# Patient Record
Sex: Male | Born: 1985
Health system: Southern US, Community
[De-identification: ages and names within clinical notes are randomized; demographics above are authoritative.]

## PROBLEM LIST (undated history)

## (undated) DIAGNOSIS — E119 Type 2 diabetes mellitus without complications: Secondary | ICD-10-CM

## (undated) DIAGNOSIS — J45909 Unspecified asthma, uncomplicated: Secondary | ICD-10-CM

---

## 2016-03-04 ENCOUNTER — Telehealth: Payer: Self-pay

## 2016-03-04 NOTE — Telephone Encounter (Signed)
Patient called and left voicemail message on nurse phone. He states that he was calling for results but he has not seen a physician here in our office. He states that he will try to find the physician name that he will be seeing and give us a call back. I did not see him listed in our system at all. He reports that he will call back once he determines if he called the wrong number or not.

## 2016-03-20 ENCOUNTER — Ambulatory Visit: Payer: Self-pay | Admitting: Cardiology

## 2016-03-26 ENCOUNTER — Encounter: Payer: Self-pay | Admitting: *Deleted

## 2016-03-26 ENCOUNTER — Ambulatory Visit: Payer: Self-pay | Admitting: Cardiology

## 2018-01-10 ENCOUNTER — Other Ambulatory Visit: Payer: Self-pay

## 2018-01-10 ENCOUNTER — Emergency Department (HOSPITAL_COMMUNITY): Payer: Worker's Compensation

## 2018-01-10 ENCOUNTER — Inpatient Hospital Stay (HOSPITAL_COMMUNITY)
Admission: EM | Admit: 2018-01-10 | Discharge: 2018-01-14 | DRG: 493 | Disposition: A | Discharge status: Home Health | Payer: Worker's Compensation | Attending: General Surgery | Admitting: General Surgery

## 2018-01-10 ENCOUNTER — Encounter (HOSPITAL_COMMUNITY): Payer: Self-pay

## 2018-01-10 ENCOUNTER — Encounter (HOSPITAL_COMMUNITY): Admission: EM | Disposition: A | Discharge status: Home Health | Payer: Self-pay | Source: Home / Self Care

## 2018-01-10 ENCOUNTER — Observation Stay (HOSPITAL_COMMUNITY): Payer: Worker's Compensation | Admitting: Anesthesiology

## 2018-01-10 ENCOUNTER — Inpatient Hospital Stay (HOSPITAL_COMMUNITY): Payer: Worker's Compensation

## 2018-01-10 ENCOUNTER — Observation Stay (HOSPITAL_COMMUNITY): Payer: Worker's Compensation

## 2018-01-10 DIAGNOSIS — S0990XA Unspecified injury of head, initial encounter: Secondary | ICD-10-CM

## 2018-01-10 DIAGNOSIS — Z23 Encounter for immunization: Secondary | ICD-10-CM | POA: Diagnosis not present

## 2018-01-10 DIAGNOSIS — S82201B Unspecified fracture of shaft of right tibia, initial encounter for open fracture type I or II: Secondary | ICD-10-CM

## 2018-01-10 DIAGNOSIS — S82261C Displaced segmental fracture of shaft of right tibia, initial encounter for open fracture type IIIA, IIIB, or IIIC: Principal | ICD-10-CM | POA: Diagnosis present

## 2018-01-10 DIAGNOSIS — E872 Acidosis: Secondary | ICD-10-CM | POA: Diagnosis present

## 2018-01-10 DIAGNOSIS — Z419 Encounter for procedure for purposes other than remedying health state, unspecified: Secondary | ICD-10-CM

## 2018-01-10 DIAGNOSIS — S82401B Unspecified fracture of shaft of right fibula, initial encounter for open fracture type I or II: Secondary | ICD-10-CM | POA: Diagnosis present

## 2018-01-10 DIAGNOSIS — T1490XA Injury, unspecified, initial encounter: Secondary | ICD-10-CM

## 2018-01-10 DIAGNOSIS — M79661 Pain in right lower leg: Secondary | ICD-10-CM | POA: Diagnosis present

## 2018-01-10 DIAGNOSIS — Y92410 Unspecified street and highway as the place of occurrence of the external cause: Secondary | ICD-10-CM | POA: Diagnosis not present

## 2018-01-10 DIAGNOSIS — S060X0A Concussion without loss of consciousness, initial encounter: Secondary | ICD-10-CM | POA: Diagnosis present

## 2018-01-10 DIAGNOSIS — Z09 Encounter for follow-up examination after completed treatment for conditions other than malignant neoplasm: Secondary | ICD-10-CM

## 2018-01-10 DIAGNOSIS — S82451B Displaced comminuted fracture of shaft of right fibula, initial encounter for open fracture type I or II: Secondary | ICD-10-CM | POA: Diagnosis present

## 2018-01-10 DIAGNOSIS — F17210 Nicotine dependence, cigarettes, uncomplicated: Secondary | ICD-10-CM | POA: Diagnosis present

## 2018-01-10 DIAGNOSIS — Y990 Civilian activity done for income or pay: Secondary | ICD-10-CM | POA: Diagnosis not present

## 2018-01-10 DIAGNOSIS — J45909 Unspecified asthma, uncomplicated: Secondary | ICD-10-CM | POA: Diagnosis present

## 2018-01-10 DIAGNOSIS — S82261B Displaced segmental fracture of shaft of right tibia, initial encounter for open fracture type I or II: Secondary | ICD-10-CM | POA: Diagnosis present

## 2018-01-10 HISTORY — DX: Unspecified asthma, uncomplicated: J45.909

## 2018-01-10 HISTORY — PX: I & D EXTREMITY: SHX5045

## 2018-01-10 HISTORY — PX: TIBIA IM NAIL INSERTION: SHX2516

## 2018-01-10 LAB — TYPE AND SCREEN
ABO/RH(D): O POS
Antibody Screen: NEGATIVE
UNIT DIVISION: 0
Unit division: 0

## 2018-01-10 LAB — I-STAT CHEM 8, ED
BUN: 19 mg/dL (ref 6–20)
CALCIUM ION: 1.13 mmol/L — AB (ref 1.15–1.40)
Chloride: 106 mmol/L (ref 98–111)
Creatinine, Ser: 1.1 mg/dL (ref 0.61–1.24)
GLUCOSE: 125 mg/dL — AB (ref 70–99)
HCT: 46 % (ref 39.0–52.0)
HEMOGLOBIN: 15.6 g/dL (ref 13.0–17.0)
Potassium: 3.2 mmol/L — ABNORMAL LOW (ref 3.5–5.1)
Sodium: 141 mmol/L (ref 135–145)
TCO2: 22 mmol/L (ref 22–32)

## 2018-01-10 LAB — PREPARE FRESH FROZEN PLASMA
UNIT DIVISION: 0
Unit division: 0

## 2018-01-10 LAB — BPAM FFP
Blood Product Expiration Date: 201908212359
Blood Product Expiration Date: 201908212359
ISSUE DATE / TIME: 201908170914
ISSUE DATE / TIME: 201908170914
UNIT TYPE AND RH: 600
Unit Type and Rh: 6200

## 2018-01-10 LAB — BPAM RBC
BLOOD PRODUCT EXPIRATION DATE: 201909102359
BLOOD PRODUCT EXPIRATION DATE: 201909202359
ISSUE DATE / TIME: 201908170913
ISSUE DATE / TIME: 201908170913
UNIT TYPE AND RH: 9500
Unit Type and Rh: 9500

## 2018-01-10 LAB — COMPREHENSIVE METABOLIC PANEL
ALT: 62 U/L — ABNORMAL HIGH (ref 0–44)
ANION GAP: 10 (ref 5–15)
AST: 46 U/L — ABNORMAL HIGH (ref 15–41)
Albumin: 4.3 g/dL (ref 3.5–5.0)
Alkaline Phosphatase: 73 U/L (ref 38–126)
BUN: 18 mg/dL (ref 6–20)
CO2: 21 mmol/L — AB (ref 22–32)
Calcium: 9.3 mg/dL (ref 8.9–10.3)
Chloride: 107 mmol/L (ref 98–111)
Creatinine, Ser: 1.2 mg/dL (ref 0.61–1.24)
GFR calc non Af Amer: >60 mL/min (ref >60)
Glucose, Bld: 130 mg/dL — ABNORMAL HIGH (ref 70–99)
Potassium: 3.3 mmol/L — ABNORMAL LOW (ref 3.5–5.1)
SODIUM: 138 mmol/L (ref 135–145)
Total Bilirubin: 1.2 mg/dL (ref 0.3–1.2)
Total Protein: 6.7 g/dL (ref 6.5–8.1)

## 2018-01-10 LAB — I-STAT CG4 LACTIC ACID, ED: Lactic Acid, Venous: 2.06 mmol/L (ref 0.5–1.9)

## 2018-01-10 LAB — ABO/RH: ABO/RH(D): O POS

## 2018-01-10 LAB — CDS SEROLOGY

## 2018-01-10 LAB — CBC
HCT: 47 % (ref 39.0–52.0)
HEMOGLOBIN: 16 g/dL (ref 13.0–17.0)
MCH: 31 pg (ref 26.0–34.0)
MCHC: 34 g/dL (ref 30.0–36.0)
MCV: 91.1 fL (ref 78.0–100.0)
Platelets: 284 10*3/uL (ref 150–400)
RBC: 5.16 MIL/uL (ref 4.22–5.81)
RDW: 12.2 % (ref 11.5–15.5)
WBC: 18.2 10*3/uL — ABNORMAL HIGH (ref 4.0–10.5)

## 2018-01-10 LAB — ETHANOL

## 2018-01-10 LAB — PROTIME-INR
INR: 0.96
PROTHROMBIN TIME: 12.7 s (ref 11.4–15.2)

## 2018-01-10 LAB — LACTIC ACID, PLASMA: Lactic Acid, Venous: 1 mmol/L (ref 0.5–1.9)

## 2018-01-10 SURGERY — INSERTION, INTRAMEDULLARY ROD, TIBIA
Anesthesia: General | Site: Leg Lower | Laterality: Right

## 2018-01-10 MED ORDER — SENNA 8.6 MG PO TABS
1.0000 | ORAL_TABLET | Freq: Two times a day (BID) | ORAL | Status: DC
  Administered 2018-01-10 – 2018-01-14 (×7): 8.6 mg via ORAL
  Filled 2018-01-10 (×7): qty 1

## 2018-01-10 MED ORDER — DOCUSATE SODIUM 100 MG PO CAPS
100.0000 mg | ORAL_CAPSULE | Freq: Two times a day (BID) | ORAL | Status: DC
  Administered 2018-01-10 – 2018-01-14 (×8): 100 mg via ORAL
  Filled 2018-01-10 (×8): qty 1

## 2018-01-10 MED ORDER — MIDAZOLAM HCL 2 MG/2ML IJ SOLN
INTRAMUSCULAR | Status: AC
  Filled 2018-01-10: qty 2

## 2018-01-10 MED ORDER — METHOCARBAMOL 500 MG PO TABS: 500.0000 mg | ORAL_TABLET | Freq: Three times a day (TID) | ORAL | Status: DC | PRN

## 2018-01-10 MED ORDER — CEFAZOLIN SODIUM-DEXTROSE 2-4 GM/100ML-% IV SOLN
INTRAVENOUS | Status: AC
  Filled 2018-01-10: qty 100

## 2018-01-10 MED ORDER — CEFAZOLIN SODIUM-DEXTROSE 1-4 GM/50ML-% IV SOLN
INTRAVENOUS | Status: AC | PRN
  Administered 2018-01-10: 2 g via INTRAVENOUS

## 2018-01-10 MED ORDER — OXYCODONE HCL 5 MG PO TABS: 5.0000 mg | ORAL_TABLET | Freq: Once | ORAL | Status: DC | PRN

## 2018-01-10 MED ORDER — FENTANYL CITRATE (PF) 100 MCG/2ML IJ SOLN
INTRAMUSCULAR | Status: AC | PRN
  Administered 2018-01-10: 100 ug via INTRAVENOUS

## 2018-01-10 MED ORDER — PROMETHAZINE HCL 25 MG/ML IJ SOLN: 6.2500 mg | INTRAMUSCULAR | Status: DC | PRN

## 2018-01-10 MED ORDER — PROPOFOL 10 MG/ML IV BOLUS
INTRAVENOUS | Status: AC
  Filled 2018-01-10: qty 20

## 2018-01-10 MED ORDER — 0.9 % SODIUM CHLORIDE (POUR BTL) OPTIME
TOPICAL | Status: DC | PRN
  Administered 2018-01-10: 1000 mL

## 2018-01-10 MED ORDER — ONDANSETRON HCL 4 MG/2ML IJ SOLN: 4.0000 mg | Freq: Four times a day (QID) | INTRAMUSCULAR | Status: DC | PRN

## 2018-01-10 MED ORDER — OXYCODONE HCL 5 MG PO TABS
5.0000 mg | ORAL_TABLET | ORAL | Status: DC | PRN
  Administered 2018-01-11 – 2018-01-14 (×15): 10 mg via ORAL
  Filled 2018-01-10 (×16): qty 2

## 2018-01-10 MED ORDER — CEFAZOLIN SODIUM-DEXTROSE 2-4 GM/100ML-% IV SOLN
2.0000 g | Freq: Three times a day (TID) | INTRAVENOUS | Status: AC
  Administered 2018-01-10 – 2018-01-12 (×6): 2 g via INTRAVENOUS
  Filled 2018-01-10 (×6): qty 100

## 2018-01-10 MED ORDER — HYDROCODONE-ACETAMINOPHEN 5-325 MG PO TABS: 1.0000 | ORAL_TABLET | ORAL | Status: DC | PRN

## 2018-01-10 MED ORDER — CEFAZOLIN SODIUM-DEXTROSE 2-4 GM/100ML-% IV SOLN
2.0000 g | Freq: Once | INTRAVENOUS | Status: AC
  Administered 2018-01-10: 2 g via INTRAVENOUS
  Filled 2018-01-10: qty 100

## 2018-01-10 MED ORDER — LIDOCAINE 2% (20 MG/ML) 5 ML SYRINGE
INTRAMUSCULAR | Status: DC | PRN
  Administered 2018-01-10: 100 mg via INTRAVENOUS

## 2018-01-10 MED ORDER — ACETAMINOPHEN 10 MG/ML IV SOLN
INTRAVENOUS | Status: AC
  Filled 2018-01-10: qty 100

## 2018-01-10 MED ORDER — FENTANYL CITRATE (PF) 100 MCG/2ML IJ SOLN
INTRAMUSCULAR | Status: AC
  Administered 2018-01-10: 50 ug via INTRAVENOUS
  Filled 2018-01-10: qty 2

## 2018-01-10 MED ORDER — VANCOMYCIN HCL 500 MG IV SOLR
INTRAVENOUS | Status: DC | PRN
  Administered 2018-01-10: 500 mg via TOPICAL

## 2018-01-10 MED ORDER — IOPAMIDOL (ISOVUE-300) INJECTION 61%
INTRAVENOUS | Status: AC
  Administered 2018-01-10: 100 mL
  Filled 2018-01-10: qty 100

## 2018-01-10 MED ORDER — ROCURONIUM BROMIDE 50 MG/5ML IV SOSY
PREFILLED_SYRINGE | INTRAVENOUS | Status: AC
  Filled 2018-01-10: qty 5

## 2018-01-10 MED ORDER — TETANUS-DIPHTH-ACELL PERTUSSIS 5-2.5-18.5 LF-MCG/0.5 IM SUSP
INTRAMUSCULAR | Status: AC
  Filled 2018-01-10: qty 0.5

## 2018-01-10 MED ORDER — ROCURONIUM BROMIDE 50 MG/5ML IV SOSY
PREFILLED_SYRINGE | INTRAVENOUS | Status: DC | PRN
  Administered 2018-01-10: 50 mg via INTRAVENOUS

## 2018-01-10 MED ORDER — SODIUM CHLORIDE 0.9 % IV SOLN
INTRAVENOUS | Status: DC
  Administered 2018-01-10: 18:00:00 via INTRAVENOUS

## 2018-01-10 MED ORDER — DEXAMETHASONE SODIUM PHOSPHATE 10 MG/ML IJ SOLN
INTRAMUSCULAR | Status: DC | PRN
  Administered 2018-01-10: 10 mg via INTRAVENOUS

## 2018-01-10 MED ORDER — ESMOLOL HCL 100 MG/10ML IV SOLN
INTRAVENOUS | Status: DC | PRN
  Administered 2018-01-10: 30 mg via INTRAVENOUS

## 2018-01-10 MED ORDER — ONDANSETRON HCL 4 MG/2ML IJ SOLN
INTRAMUSCULAR | Status: AC
  Filled 2018-01-10: qty 2

## 2018-01-10 MED ORDER — DEXTROSE 5 % IV SOLN
500.0000 mg | Freq: Once | INTRAVENOUS | Status: AC
  Administered 2018-01-10: 500 mg via INTRAVENOUS
  Filled 2018-01-10: qty 5

## 2018-01-10 MED ORDER — ONDANSETRON HCL 4 MG/2ML IJ SOLN
INTRAMUSCULAR | Status: DC | PRN
  Administered 2018-01-10: 4 mg via INTRAVENOUS

## 2018-01-10 MED ORDER — METOCLOPRAMIDE HCL 5 MG PO TABS: 5.0000 mg | ORAL_TABLET | Freq: Three times a day (TID) | ORAL | Status: DC | PRN

## 2018-01-10 MED ORDER — MIDAZOLAM HCL 5 MG/5ML IJ SOLN
INTRAMUSCULAR | Status: DC | PRN
  Administered 2018-01-10: 2 mg via INTRAVENOUS

## 2018-01-10 MED ORDER — SUCCINYLCHOLINE CHLORIDE 200 MG/10ML IV SOSY
PREFILLED_SYRINGE | INTRAVENOUS | Status: AC
  Filled 2018-01-10: qty 10

## 2018-01-10 MED ORDER — HYDROCODONE-ACETAMINOPHEN 7.5-325 MG PO TABS
1.0000 | ORAL_TABLET | ORAL | Status: DC | PRN
  Administered 2018-01-10 – 2018-01-11 (×2): 1 via ORAL
  Filled 2018-01-10 (×3): qty 1

## 2018-01-10 MED ORDER — SODIUM CHLORIDE 0.9 % IR SOLN
Status: DC | PRN
  Administered 2018-01-10: 6000 mL

## 2018-01-10 MED ORDER — LIDOCAINE 2% (20 MG/ML) 5 ML SYRINGE
INTRAMUSCULAR | Status: AC
  Filled 2018-01-10: qty 5

## 2018-01-10 MED ORDER — SUGAMMADEX SODIUM 200 MG/2ML IV SOLN
INTRAVENOUS | Status: DC | PRN
  Administered 2018-01-10: 200 mg via INTRAVENOUS

## 2018-01-10 MED ORDER — FENTANYL CITRATE (PF) 250 MCG/5ML IJ SOLN
INTRAMUSCULAR | Status: AC
  Filled 2018-01-10: qty 5

## 2018-01-10 MED ORDER — DIPHENHYDRAMINE HCL 12.5 MG/5ML PO ELIX: 12.5000 mg | ORAL_SOLUTION | ORAL | Status: DC | PRN

## 2018-01-10 MED ORDER — POTASSIUM CHLORIDE IN NACL 20-0.9 MEQ/L-% IV SOLN
INTRAVENOUS | Status: DC
  Administered 2018-01-10 – 2018-01-12 (×4): via INTRAVENOUS
  Filled 2018-01-10 (×4): qty 1000

## 2018-01-10 MED ORDER — ENOXAPARIN SODIUM 40 MG/0.4ML ~~LOC~~ SOLN
40.0000 mg | SUBCUTANEOUS | Status: DC
  Administered 2018-01-11 – 2018-01-14 (×4): 40 mg via SUBCUTANEOUS
  Filled 2018-01-10 (×5): qty 0.4

## 2018-01-10 MED ORDER — FENTANYL CITRATE (PF) 100 MCG/2ML IJ SOLN
25.0000 ug | INTRAMUSCULAR | Status: DC | PRN
  Administered 2018-01-10 (×2): 50 ug via INTRAVENOUS

## 2018-01-10 MED ORDER — SODIUM CHLORIDE 0.9 % IV SOLN
INTRAVENOUS | Status: AC | PRN
  Administered 2018-01-10: 1000 mL via INTRAVENOUS

## 2018-01-10 MED ORDER — LACTATED RINGERS IV SOLN
INTRAVENOUS | Status: DC
  Administered 2018-01-10 (×2): via INTRAVENOUS

## 2018-01-10 MED ORDER — PROPOFOL 10 MG/ML IV BOLUS
INTRAVENOUS | Status: DC | PRN
  Administered 2018-01-10 (×2): 100 mg via INTRAVENOUS
  Administered 2018-01-10: 200 mg via INTRAVENOUS

## 2018-01-10 MED ORDER — VANCOMYCIN HCL 500 MG IV SOLR
INTRAVENOUS | Status: AC
  Filled 2018-01-10: qty 500

## 2018-01-10 MED ORDER — OXYCODONE HCL 5 MG/5ML PO SOLN: 5.0000 mg | Freq: Once | ORAL | Status: DC | PRN

## 2018-01-10 MED ORDER — FENTANYL CITRATE (PF) 100 MCG/2ML IJ SOLN
INTRAMUSCULAR | Status: DC | PRN
  Administered 2018-01-10: 100 ug via INTRAVENOUS
  Administered 2018-01-10 (×3): 50 ug via INTRAVENOUS

## 2018-01-10 MED ORDER — TETANUS-DIPHTH-ACELL PERTUSSIS 5-2.5-18.5 LF-MCG/0.5 IM SUSP
0.5000 mL | Freq: Once | INTRAMUSCULAR | Status: AC
  Administered 2018-01-10: 0.5 mL via INTRAMUSCULAR

## 2018-01-10 MED ORDER — MORPHINE SULFATE (PF) 2 MG/ML IV SOLN
0.5000 mg | INTRAVENOUS | Status: DC | PRN
  Administered 2018-01-11 – 2018-01-12 (×5): 1 mg via INTRAVENOUS
  Filled 2018-01-10 (×6): qty 1

## 2018-01-10 MED ORDER — METOCLOPRAMIDE HCL 5 MG/ML IJ SOLN: 5.0000 mg | Freq: Three times a day (TID) | INTRAMUSCULAR | Status: DC | PRN

## 2018-01-10 MED ORDER — ACETAMINOPHEN 325 MG PO TABS: 650.0000 mg | ORAL_TABLET | ORAL | Status: DC | PRN

## 2018-01-10 MED ORDER — HYDROMORPHONE HCL 1 MG/ML IJ SOLN
1.0000 mg | INTRAMUSCULAR | Status: DC | PRN
  Administered 2018-01-10: 1 mg via INTRAVENOUS
  Filled 2018-01-10: qty 1

## 2018-01-10 MED ORDER — DEXAMETHASONE SODIUM PHOSPHATE 10 MG/ML IJ SOLN
INTRAMUSCULAR | Status: AC
  Filled 2018-01-10: qty 1

## 2018-01-10 MED ORDER — ACETAMINOPHEN 10 MG/ML IV SOLN
INTRAVENOUS | Status: AC
  Administered 2018-01-10: 1000 mg via INTRAVENOUS
  Filled 2018-01-10: qty 100

## 2018-01-10 MED ORDER — ONDANSETRON 4 MG PO TBDP: 4.0000 mg | ORAL_TABLET | Freq: Four times a day (QID) | ORAL | Status: DC | PRN

## 2018-01-10 MED ORDER — ACETAMINOPHEN 10 MG/ML IV SOLN
1000.0000 mg | Freq: Once | INTRAVENOUS | Status: AC
Start: 2018-01-10 — End: 2018-01-10
  Administered 2018-01-10: 1000 mg via INTRAVENOUS

## 2018-01-10 MED ORDER — SUCCINYLCHOLINE CHLORIDE 20 MG/ML IJ SOLN
INTRAMUSCULAR | Status: DC | PRN
  Administered 2018-01-10: 120 mg via INTRAVENOUS

## 2018-01-10 SURGICAL SUPPLY — 95 items
BANDAGE ACE 4X5 VEL STRL LF (GAUZE/BANDAGES/DRESSINGS) ×3 IMPLANT
BANDAGE ACE 6X5 VEL STRL LF (GAUZE/BANDAGES/DRESSINGS) ×3 IMPLANT
BANDAGE ELASTIC 4 VELCRO ST LF (GAUZE/BANDAGES/DRESSINGS) ×3 IMPLANT
BANDAGE ESMARK 6X9 LF (GAUZE/BANDAGES/DRESSINGS) ×1 IMPLANT
BIT DRILL 2.5X2.75 QC CALB (BIT) ×3 IMPLANT
BIT DRILL 4.4 (MISCELLANEOUS) ×3 IMPLANT
BIT DRILL 6X3.8 (MISCELLANEOUS) ×3 IMPLANT
BLADE SURG 10 STRL SS (BLADE) ×3 IMPLANT
BNDG COHESIVE 4X5 TAN STRL (GAUZE/BANDAGES/DRESSINGS) ×3 IMPLANT
BNDG COHESIVE 6X5 TAN STRL LF (GAUZE/BANDAGES/DRESSINGS) ×3 IMPLANT
BNDG ESMARK 6X9 LF (GAUZE/BANDAGES/DRESSINGS) ×3
BNDG GAUZE ELAST 4 BULKY (GAUZE/BANDAGES/DRESSINGS) ×3 IMPLANT
BUCKET CAST 5QT PAPER WAX WHT (CAST SUPPLIES) ×3 IMPLANT
COVER SURGICAL LIGHT HANDLE (MISCELLANEOUS) ×6 IMPLANT
CUFF TOURNIQUET SINGLE 18IN (TOURNIQUET CUFF) IMPLANT
CUFF TOURNIQUET SINGLE 34IN LL (TOURNIQUET CUFF) ×3 IMPLANT
CUFF TOURNIQUET SINGLE 44IN (TOURNIQUET CUFF) IMPLANT
DRAPE C-ARM 42X72 X-RAY (DRAPES) ×3 IMPLANT
DRAPE C-ARMOR (DRAPES) ×3 IMPLANT
DRAPE IMP U-DRAPE 54X76 (DRAPES) ×3 IMPLANT
DRAPE ORTHO SPLIT 77X108 STRL (DRAPES) ×2
DRAPE SURG ORHT 6 SPLT 77X108 (DRAPES) ×1 IMPLANT
DRAPE U-SHAPE 47X51 STRL (DRAPES) ×3 IMPLANT
DRSG ADAPTIC 3X8 NADH LF (GAUZE/BANDAGES/DRESSINGS) ×3 IMPLANT
DRSG MEPITEL 3X4 ME34 (GAUZE/BANDAGES/DRESSINGS) ×3 IMPLANT
DRSG PAD ABDOMINAL 8X10 ST (GAUZE/BANDAGES/DRESSINGS) ×6 IMPLANT
DRSG VAC ATS MED SENSATRAC (GAUZE/BANDAGES/DRESSINGS) ×3 IMPLANT
DURAPREP 26ML APPLICATOR (WOUND CARE) ×3 IMPLANT
ELECT REM PT RETURN 9FT ADLT (ELECTROSURGICAL) ×3
ELECTRODE REM PT RTRN 9FT ADLT (ELECTROSURGICAL) ×1 IMPLANT
FACESHIELD WRAPAROUND (MASK) ×3 IMPLANT
GAUZE SPONGE 4X4 12PLY STRL (GAUZE/BANDAGES/DRESSINGS) ×3 IMPLANT
GAUZE SPONGE 4X4 16PLY XRAY LF (GAUZE/BANDAGES/DRESSINGS) ×6 IMPLANT
GLOVE BIO SURGEON STRL SZ 6.5 (GLOVE) ×2 IMPLANT
GLOVE BIO SURGEON STRL SZ7 (GLOVE) ×3 IMPLANT
GLOVE BIO SURGEON STRL SZ8 (GLOVE) ×6 IMPLANT
GLOVE BIO SURGEONS STRL SZ 6.5 (GLOVE) ×1
GLOVE BIOGEL M 7.0 STRL (GLOVE) IMPLANT
GLOVE BIOGEL M STRL SZ7.5 (GLOVE) IMPLANT
GLOVE BIOGEL PI IND STRL 7.5 (GLOVE) ×1 IMPLANT
GLOVE BIOGEL PI IND STRL 8 (GLOVE) ×1 IMPLANT
GLOVE BIOGEL PI IND STRL 8.5 (GLOVE) ×1 IMPLANT
GLOVE BIOGEL PI INDICATOR 7.5 (GLOVE) ×2
GLOVE BIOGEL PI INDICATOR 8 (GLOVE) ×2
GLOVE BIOGEL PI INDICATOR 8.5 (GLOVE) ×2
GLOVE ECLIPSE 8.0 STRL XLNG CF (GLOVE) IMPLANT
GLOVE ORTHO TXT STRL SZ7.5 (GLOVE) ×6 IMPLANT
GLOVE SURG ORTHO 8.5 STRL (GLOVE) ×9 IMPLANT
GOWN STRL REUS W/ TWL LRG LVL3 (GOWN DISPOSABLE) ×3 IMPLANT
GOWN STRL REUS W/TWL LRG LVL3 (GOWN DISPOSABLE) ×9 IMPLANT
GOWN STRL REUS W/TWL XL LVL3 (GOWN DISPOSABLE) ×12 IMPLANT
GUIDEWIRE BALL NOSE 80CM (WIRE) ×3 IMPLANT
HANDPIECE INTERPULSE COAX TIP (DISPOSABLE) ×2
KIT BASIN OR (CUSTOM PROCEDURE TRAY) ×3 IMPLANT
KIT TURNOVER KIT B (KITS) ×3 IMPLANT
MANIFOLD NEPTUNE II (INSTRUMENTS) ×3 IMPLANT
NAIL TIBIAL 8X36CM (Nail) ×3 IMPLANT
NAIL TIBIAL 9MMX36CM (Nail) ×3 IMPLANT
PACK ORTHO EXTREMITY (CUSTOM PROCEDURE TRAY) ×3 IMPLANT
PACK UNIVERSAL I (CUSTOM PROCEDURE TRAY) ×3 IMPLANT
PAD ARMBOARD 7.5X6 YLW CONV (MISCELLANEOUS) ×6 IMPLANT
PAD CAST 4YDX4 CTTN HI CHSV (CAST SUPPLIES) ×1 IMPLANT
PADDING CAST COTTON 4X4 STRL (CAST SUPPLIES) ×2
PADDING CAST COTTON 6X4 STRL (CAST SUPPLIES) ×3 IMPLANT
PADDING CAST SYN 6 (CAST SUPPLIES) ×2
PADDING CAST SYNTHETIC 4 (CAST SUPPLIES) ×2
PADDING CAST SYNTHETIC 4X4 STR (CAST SUPPLIES) ×1 IMPLANT
PADDING CAST SYNTHETIC 6X4 NS (CAST SUPPLIES) ×1 IMPLANT
PIN GUIDE 3.2X14 1401214 (MISCELLANEOUS) ×3 IMPLANT
PLATE ACE 100DEG 6HOLE (Plate) ×3 IMPLANT
SCREW ACECAP 36MM (Screw) ×3 IMPLANT
SCREW ACECAP 42MM (Screw) ×3 IMPLANT
SCREW CORTICAL 3.5MM  12MM (Screw) ×10 IMPLANT
SCREW CORTICAL 3.5MM 12MM (Screw) ×5 IMPLANT
SCREW PROXIMAL 4.5MMX18MM (Screw) ×3 IMPLANT
SCREW PROXIMAL DEPUY (Screw) ×2 IMPLANT
SCREW PRXML FT 45X5.5XLCK NS (Screw) ×1 IMPLANT
SET HNDPC FAN SPRY TIP SCT (DISPOSABLE) ×1 IMPLANT
SPONGE LAP 18X18 X RAY DECT (DISPOSABLE) ×3 IMPLANT
STAPLER VISISTAT 35W (STAPLE) IMPLANT
SUT ETHILON 2 0 PSLX (SUTURE) ×9 IMPLANT
SUT ETHILON 3 0 PS 1 (SUTURE) ×3 IMPLANT
SUT MON AB 2-0 CT1 27 (SUTURE) ×3 IMPLANT
SUT VIC AB 1 CT1 27 (SUTURE) ×2
SUT VIC AB 1 CT1 27XBRD ANBCTR (SUTURE) ×1 IMPLANT
SUT VIC AB 2-0 CT1 27 (SUTURE) ×4
SUT VIC AB 2-0 CT1 TAPERPNT 27 (SUTURE) ×2 IMPLANT
SYR CONTROL 10ML LL (SYRINGE) ×3 IMPLANT
TOWEL OR 17X24 6PK STRL BLUE (TOWEL DISPOSABLE) ×3 IMPLANT
TOWEL OR 17X26 10 PK STRL BLUE (TOWEL DISPOSABLE) ×6 IMPLANT
TUBE CONNECTING 12'X1/4 (SUCTIONS) ×1
TUBE CONNECTING 12X1/4 (SUCTIONS) ×2 IMPLANT
UNDERPAD 30X30 (UNDERPADS AND DIAPERS) ×3 IMPLANT
WND VAC CANISTER 500ML (MISCELLANEOUS) ×3 IMPLANT
YANKAUER SUCT BULB TIP NO VENT (SUCTIONS) ×3 IMPLANT

## 2018-01-10 NOTE — ED Notes (Signed)
Pt taken to xray 

## 2018-01-10 NOTE — ED Notes (Signed)
Pt's c-collar replaced with aspen collar by this RN with assistance from [employer] RN
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
## 2018-01-10 NOTE — Transfer of Care (Signed)
Immediate Anesthesia Transfer of Care Note  Patient: Francis Mahoney  Procedure(s) Performed: INTRAMEDULLARY (IM) NAIL TIBIAL (Right Leg Lower) IRRIGATION AND DEBRIDEMENT TIBIA (Right Leg Lower)  Patient Location: PACU  Anesthesia Type:General  Level of Consciousness: drowsy  Airway & Oxygen Therapy: Patient Spontanous Breathing and Patient connected to nasal cannula oxygen  Post-op Assessment: Report given to RN, Post -op Vital signs reviewed and stable and Patient moving all extremities X 4  Post vital signs: Reviewed and stable  Last Vitals:  Vitals Value Taken Time  BP 127/84 01/10/2018  3:48 PM  Temp    Pulse 100 01/10/2018  3:51 PM  Resp 18 01/10/2018  3:51 PM  SpO2 96 % 01/10/2018  3:51 PM  Vitals shown include unvalidated device data.  Last Pain:  Vitals:   01/10/18 1113  TempSrc:   PainSc: 10-Worst pain ever         Complications: No apparent anesthesia complications

## 2018-01-10 NOTE — Anesthesia Procedure Notes (Addendum)
Procedure Name: Intubation Date/Time: 01/10/2018 12:56 PM Performed by: Fransisca KaufmannMeyer, Chanay Nugent E, CRNA Pre-anesthesia Checklist: Patient identified, Emergency Drugs available, Suction available and Patient being monitored Patient Re-evaluated:Patient Re-evaluated prior to induction Oxygen Delivery Method: Circle System Utilized Preoxygenation: Pre-oxygenation with 100% oxygen Induction Type: IV induction and Rapid sequence Ventilation: Mask ventilation without difficulty Laryngoscope Size: Glidescope and 4 Grade View: Grade I Tube type: Oral Tube size: 7.5 mm Number of attempts: 1 Airway Equipment and Method: Stylet and Oral airway Placement Confirmation: ETT inserted through vocal cords under direct vision,  positive ETCO2 and breath sounds checked- equal and bilateral Secured at: 24 cm Tube secured with: Tape Dental Injury: Teeth and Oropharynx as per pre-operative assessment  Difficulty Due To: Difficult Airway- due to cervical collar Comments: Cervical collar remained in place during induction and intubation/no movement of neck occured
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
## 2018-01-10 NOTE — Anesthesia Postprocedure Evaluation (Signed)
Anesthesia Post Note  Patient: Francis Mahoney  Procedure(s) Performed: INTRAMEDULLARY (IM) NAIL TIBIAL (Right Leg Lower) IRRIGATION AND DEBRIDEMENT TIBIA (Right Leg Lower)     Patient location during evaluation: PACU Anesthesia Type: General Level of consciousness: awake and alert Pain management: pain level controlled Vital Signs Assessment: post-procedure vital signs reviewed and stable Respiratory status: spontaneous breathing, nonlabored ventilation, respiratory function stable and patient connected to nasal cannula oxygen Cardiovascular status: blood pressure returned to baseline and stable Postop Assessment: no apparent nausea or vomiting Anesthetic complications: no    Last Vitals:  Vitals:   01/10/18 1715 01/10/18 1737  BP: 126/77 134/88  Pulse: (!) 57 72  Resp: 15 18  Temp: 36.7 C 36.8 C  SpO2: 100% 97%    Last Pain:  Vitals:   01/10/18 1911  TempSrc:   PainSc: 6                  Beryle Lathehomas E Judea Riches

## 2018-01-10 NOTE — ED Notes (Signed)
Pt assisted to sit up and attempting to urinate using urinal
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
## 2018-01-11 LAB — BASIC METABOLIC PANEL
Anion gap: 5 (ref 5–15)
BUN: 11 mg/dL (ref 6–20)
CALCIUM: 8.8 mg/dL — AB (ref 8.9–10.3)
CO2: 23 mmol/L (ref 22–32)
CREATININE: 1.09 mg/dL (ref 0.61–1.24)
Chloride: 111 mmol/L (ref 98–111)
GFR calc Af Amer: >60 mL/min (ref >60)
Glucose, Bld: 105 mg/dL — ABNORMAL HIGH (ref 70–99)
Potassium: 4.5 mmol/L (ref 3.5–5.1)
SODIUM: 139 mmol/L (ref 135–145)

## 2018-01-11 LAB — CBC
HCT: 40.9 % (ref 39.0–52.0)
Hemoglobin: 14 g/dL (ref 13.0–17.0)
MCH: 31.3 pg (ref 26.0–34.0)
MCHC: 34.2 g/dL (ref 30.0–36.0)
MCV: 91.3 fL (ref 78.0–100.0)
PLATELETS: 245 10*3/uL (ref 150–400)
RBC: 4.48 MIL/uL (ref 4.22–5.81)
RDW: 12.4 % (ref 11.5–15.5)
WBC: 15.1 10*3/uL — ABNORMAL HIGH (ref 4.0–10.5)

## 2018-01-11 LAB — URINALYSIS, ROUTINE W REFLEX MICROSCOPIC
Bilirubin Urine: NEGATIVE
GLUCOSE, UA: NEGATIVE mg/dL
HGB URINE DIPSTICK: NEGATIVE
KETONES UR: 20 mg/dL — AB
Leukocytes, UA: NEGATIVE
Nitrite: NEGATIVE
PROTEIN: NEGATIVE mg/dL
Specific Gravity, Urine: 1.017 (ref 1.005–1.030)
pH: 5 (ref 5.0–8.0)

## 2018-01-11 LAB — HIV ANTIBODY (ROUTINE TESTING W REFLEX): HIV SCREEN 4TH GENERATION: NONREACTIVE

## 2018-01-11 MED ORDER — ACETAMINOPHEN 500 MG PO TABS
1000.0000 mg | ORAL_TABLET | Freq: Three times a day (TID) | ORAL | Status: DC
  Administered 2018-01-11 – 2018-01-14 (×11): 1000 mg via ORAL
  Filled 2018-01-11 (×13): qty 2

## 2018-01-11 MED ORDER — PNEUMOCOCCAL VAC POLYVALENT 25 MCG/0.5ML IJ INJ
0.5000 mL | INJECTION | INTRAMUSCULAR | Status: AC
  Administered 2018-01-12: 0.5 mL via INTRAMUSCULAR
  Filled 2018-01-11: qty 0.5

## 2018-01-11 MED ORDER — METHOCARBAMOL 500 MG PO TABS
500.0000 mg | ORAL_TABLET | Freq: Three times a day (TID) | ORAL | Status: DC
  Administered 2018-01-11 – 2018-01-12 (×4): 500 mg via ORAL
  Filled 2018-01-11 (×4): qty 1

## 2018-01-11 MED ORDER — POLYETHYLENE GLYCOL 3350 17 G PO PACK: 17.0000 g | PACK | Freq: Every day | ORAL | Status: DC | PRN

## 2018-01-11 MED ORDER — BACITRACIN-NEOMYCIN-POLYMYXIN OINTMENT TUBE
TOPICAL_OINTMENT | Freq: Three times a day (TID) | CUTANEOUS | Status: DC
  Administered 2018-01-11: 1 via TOPICAL
  Administered 2018-01-11 – 2018-01-12 (×3): via TOPICAL
  Administered 2018-01-12: 1 via TOPICAL
  Administered 2018-01-14 (×2): via TOPICAL
  Filled 2018-01-11: qty 14
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
## 2018-01-11 NOTE — Progress Notes (Signed)
Subjective: 1 Day Post-Op Procedure(s) (LRB): INTRAMEDULLARY (IM) NAIL TIBIAL (Right) IRRIGATION AND DEBRIDEMENT TIBIA (Right) Patient reports pain as 5 on 0-10 scale.   [name]'s pain is fairly well-controlled but he is having increased pain as he attempts to move his leg while in bed.  He has been able to eat and drink without difficulty.  He denies chest pain, SOB, N/V/D, fever, or chills.   Objective: Vital signs in last 24 hours: Temp:  [97.8 F (36.6 C)-98.7 F (37.1 C)] 98.7 F (37.1 C) (08/18 0533) Pulse Rate:  [57-111] 89 (08/18 0533) Resp:  [13-32] 16 (08/18 0533) BP: (118-149)/(73-99) 123/81 (08/18 0533) SpO2:  [90 %-100 %] 97 % (08/18 0533)  Intake/Output from previous day: 08/17 0701 - 08/18 0700 In: 3999.2 [P.O.:240; I.V.:3481.8; IV Piggyback:277.4] Out: 1525 [Urine:1300; Drains:25; Blood:200] Intake/Output this shift: Total I/O In: -  Out: 225 [Urine:225]  Recent Labs    01/10/18 0916 01/10/18 0917 01/11/18 0352  HGB 16.0 15.6 14.0   Recent Labs    01/10/18 0916 01/10/18 0917 01/11/18 0352  WBC 18.2*  --  15.1*  RBC 5.16  --  4.48  HCT 47.0 46.0 40.9  PLT 284  --  245   Recent Labs    01/10/18 0916 01/10/18 0917 01/11/18 0352  NA 138 141 139  K 3.3* 3.2* 4.5  CL 107 106 111  CO2 21*  --  23  BUN 18 19 11   CREATININE 1.20 1.10 1.09  GLUCOSE 130* 125* 105*  CALCIUM 9.3  --  8.8*   Recent Labs    01/10/18 0916  INR 0.96    A&O x 4 Sensation intact to light touch distally Neurovascularly intact Compartments are soft and compressible Dressing is C/D/I without drainage.  Able to wiggle all toes and is able to gently perform plantarflexion and dorsiflexion    Assessment/Plan: 1 Day Post-Op Procedure(s) (LRB): INTRAMEDULLARY (IM) NAIL TIBIAL (Right) IRRIGATION AND DEBRIDEMENT TIBIA (Right) Advance diet Up with therapy Touchdown WB to the RLE  Continue with IV antibiotics for the first 48 hours after surgery due to the open  fracture Continue with pain management as needed.    Francis Mahoney 01/11/2018, 9:22 AM
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
## 2018-01-11 NOTE — Plan of Care (Signed)

## 2018-01-12 ENCOUNTER — Encounter (HOSPITAL_COMMUNITY): Payer: Self-pay | Admitting: Orthopedic Surgery

## 2018-01-12 LAB — BASIC METABOLIC PANEL
Anion gap: 5 (ref 5–15)
BUN: 9 mg/dL (ref 6–20)
CALCIUM: 8.7 mg/dL — AB (ref 8.9–10.3)
CHLORIDE: 108 mmol/L (ref 98–111)
CO2: 26 mmol/L (ref 22–32)
CREATININE: 1.03 mg/dL (ref 0.61–1.24)
Glucose, Bld: 113 mg/dL — ABNORMAL HIGH (ref 70–99)
Potassium: 3.7 mmol/L (ref 3.5–5.1)
Sodium: 139 mmol/L (ref 135–145)

## 2018-01-12 LAB — CBC
HCT: 36.1 % — ABNORMAL LOW (ref 39.0–52.0)
HEMOGLOBIN: 12 g/dL — AB (ref 13.0–17.0)
MCH: 30.8 pg (ref 26.0–34.0)
MCHC: 33.2 g/dL (ref 30.0–36.0)
MCV: 92.6 fL (ref 78.0–100.0)
PLATELETS: 203 10*3/uL (ref 150–400)
RBC: 3.9 MIL/uL — ABNORMAL LOW (ref 4.22–5.81)
RDW: 12.7 % (ref 11.5–15.5)
WBC: 13.2 10*3/uL — ABNORMAL HIGH (ref 4.0–10.5)

## 2018-01-12 LAB — BLOOD PRODUCT ORDER (VERBAL) VERIFICATION

## 2018-01-12 MED ORDER — HYDROMORPHONE HCL 1 MG/ML IJ SOLN
1.0000 mg | Freq: Four times a day (QID) | INTRAMUSCULAR | Status: DC | PRN
  Administered 2018-01-12 – 2018-01-14 (×5): 1 mg via INTRAVENOUS
  Filled 2018-01-12 (×6): qty 1

## 2018-01-12 MED ORDER — HYDROMORPHONE HCL 1 MG/ML IJ SOLN
1.0000 mg | Freq: Once | INTRAMUSCULAR | Status: AC
  Administered 2018-01-12: 1 mg via INTRAVENOUS
  Filled 2018-01-12: qty 1

## 2018-01-12 MED ORDER — METHOCARBAMOL 750 MG PO TABS
750.0000 mg | ORAL_TABLET | Freq: Three times a day (TID) | ORAL | Status: DC
  Administered 2018-01-12 – 2018-01-14 (×7): 750 mg via ORAL
  Filled 2018-01-12 (×7): qty 1
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
## 2018-01-12 NOTE — Social Work (Signed)
CSW acknowledging consult for support with workers comp paperwork. For support with FMLA/Workers Comp on trauma service please consult RN Case Manager.   CSW signing off. Please consult if any additional needs arise.  Francis Mahoney, LCSWA [facility] Clinical Social Work [phone]
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
## 2018-01-12 NOTE — Progress Notes (Signed)
Patient ID: Francis Mahoney, male   DOB: May 08, 1986, 32 y.o.   MRN: [id number]   LOS: 2 days   Subjective: Doing well from lower leg standpoint. Denies N/T or excess pain. +ROM with PT/OT.   Objective: Vital signs in last 24 hours: Temp:  [98.6 F (37 C)-99.5 F (37.5 C)] 99.5 F (37.5 C) (08/19 0441) Pulse Rate:  [70-104] 104 (08/19 0441) Resp:  [14-19] 19 (08/19 0441) BP: (121-132)/(72-86) 121/81 (08/19 0441) SpO2:  [96 %-100 %] 96 % (08/19 0441) Last BM Date: 01/10/18   Laboratory  CBC Recent Labs    01/11/18 0352 01/12/18 0558  WBC 15.1* 13.2*  HGB 14.0 12.0*  HCT 40.9 36.1*  PLT 245 203   BMET Recent Labs    01/11/18 0352 01/12/18 0558  NA 139 139  K 4.5 3.7  CL 111 108  CO2 23 26  GLUCOSE 105* 113*  BUN 11 9  CREATININE 1.09 1.03  CALCIUM 8.8* 8.7*     Physical Exam General appearance: alert and no distress  RLE ACE, wound VAC in place  Compartments soft  Sens DPN, SPN, TN intact  Motor EHL, ext, flex, evers 5/5  DP 2+, No significant edema   Assessment/Plan: Open right tibia fx s/p IMN -- TDWB, ROM RLE. F/u with Dr. Linna CapriceSwinteck in office in 2 weeks.    Freeman CaldronMichael J. Aditri Louischarles, PA-C Orthopedic Surgery [phone] 01/12/2018
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
## 2018-01-13 ENCOUNTER — Encounter (HOSPITAL_COMMUNITY): Payer: Self-pay | Admitting: General Practice

## 2018-01-13 ENCOUNTER — Other Ambulatory Visit: Payer: Self-pay

## 2018-01-13 LAB — CBC
HCT: 34.4 % — ABNORMAL LOW (ref 39.0–52.0)
Hemoglobin: 11.6 g/dL — ABNORMAL LOW (ref 13.0–17.0)
MCH: 31.2 pg (ref 26.0–34.0)
MCHC: 33.7 g/dL (ref 30.0–36.0)
MCV: 92.5 fL (ref 78.0–100.0)
PLATELETS: 205 10*3/uL (ref 150–400)
RBC: 3.72 MIL/uL — ABNORMAL LOW (ref 4.22–5.81)
RDW: 12.4 % (ref 11.5–15.5)
WBC: 13.2 10*3/uL — ABNORMAL HIGH (ref 4.0–10.5)

## 2018-01-13 MED ORDER — ACETAMINOPHEN 500 MG PO TABS: 1000.0000 mg | ORAL_TABLET | Freq: Three times a day (TID) | ORAL | 0 refills | Status: AC | PRN

## 2018-01-13 MED ORDER — ENOXAPARIN SODIUM 40 MG/0.4ML ~~LOC~~ SOLN: 40.0000 mg | SUBCUTANEOUS | 0 refills | Status: DC

## 2018-01-13 MED ORDER — BACITRACIN-NEOMYCIN-POLYMYXIN OINTMENT TUBE: 1.0000 "application " | TOPICAL_OINTMENT | Freq: Three times a day (TID) | CUTANEOUS | Status: AC

## 2018-01-13 MED ORDER — DOCUSATE SODIUM 100 MG PO CAPS: 100.0000 mg | ORAL_CAPSULE | Freq: Two times a day (BID) | ORAL | 0 refills | Status: AC

## 2018-01-13 MED ORDER — OXYCODONE HCL 10 MG PO TABS: 5.0000 mg | ORAL_TABLET | Freq: Four times a day (QID) | ORAL | 0 refills | Status: DC | PRN

## 2018-01-13 MED ORDER — TRAMADOL HCL 50 MG PO TABS: 50.0000 mg | ORAL_TABLET | Freq: Four times a day (QID) | ORAL | 0 refills | Status: DC | PRN

## 2018-01-13 MED ORDER — METHOCARBAMOL 750 MG PO TABS: 750.0000 mg | ORAL_TABLET | Freq: Three times a day (TID) | ORAL | 0 refills | Status: DC | PRN

## 2018-01-13 MED ORDER — POLYETHYLENE GLYCOL 3350 17 G PO PACK: 17.0000 g | PACK | Freq: Every day | ORAL | 0 refills | Status: AC | PRN

## 2018-01-13 MED ORDER — TRAMADOL HCL 50 MG PO TABS
50.0000 mg | ORAL_TABLET | Freq: Four times a day (QID) | ORAL | Status: DC
  Administered 2018-01-13 – 2018-01-14 (×7): 50 mg via ORAL
  Filled 2018-01-13 (×7): qty 1

## 2018-01-13 NOTE — Progress Notes (Signed)
Pt for discharge next 24 hours.  Will need HH follow up, DME.  [facility], Dolores Loryeresa Rutledge, phone: [phone]; fax:  [phone].  Updated CM on pt condition and dc plan.  Faxed clinical information to case manager, per her request.  Faxed HH and DME orders to [facility] at [phone], attention Nursing/DME.    Quintella BatonJulie W. Monque Haggar, RN, BSN  Trauma/Neuro ICU Case Manager [phone]
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
## 2018-01-14 MED FILL — ENOXAPARIN 40 MG/0.4 ML SYR: 40 | 10 days supply | Qty: 4 | Fill #0

## 2018-01-14 MED FILL — oxyCODONE HCL 10 MG TABS: 10 | 3 days supply | Qty: 10 | Fill #0

## 2018-01-14 MED FILL — METHOCARBAMOL 750 MG TABS: 750 | 10 days supply | Qty: 30 | Fill #0

## 2018-01-14 MED FILL — traMADol HCL 50 MG TABS: 50 | 5 days supply | Qty: 20 | Fill #0
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
## 2018-01-14 NOTE — Progress Notes (Signed)
    Subjective:  Patient reports pain as moderate.  Denies N/V/CP/SOB. Denies numbness/tingling.  Objective:   VITALS:   Vitals:   01/13/18 0553 01/13/18 1341 01/13/18 2247 01/14/18 0535  BP: (!) 149/97 126/84 139/83 (!) 141/93  Pulse: 97 93 91 95  Resp: 18 20 17 18   Temp: 98.6 F (37 C) 99 F (37.2 C) 98.8 F (37.1 C)   TempSrc: Oral Oral Oral   SpO2: 99% 100% 97% 99%  Weight:      Height:        NAD ABD soft Sensation intact distally Intact pulses distally Dorsiflexion/Plantar flexion intact Incision: dressing C/D/I Compartment soft No pain with passive stretch   Lab Results  Component Value Date   WBC 13.2 (H) 01/13/2018   HGB 11.6 (L) 01/13/2018   HCT 34.4 (L) 01/13/2018   MCV 92.5 01/13/2018   PLT 205 01/13/2018   BMET    Component Value Date/Time   NA 139 01/12/2018 0558   K 3.7 01/12/2018 0558   CL 108 01/12/2018 0558   CO2 26 01/12/2018 0558   GLUCOSE 113 (H) 01/12/2018 0558   BUN 9 01/12/2018 0558   CREATININE 1.03 01/12/2018 0558   CALCIUM 8.7 (L) 01/12/2018 0558   GFRNONAA >60 01/12/2018 0558   GFRAA >60 01/12/2018 0558     Assessment/Plan: 4 Days Post-Op   Active Problems:   Open fracture of right tibia and fibula   Open displaced segmental fracture of shaft of right tibia   TDWB with walker DVT ppx: lovenox in house --> d/c home on ASA, SCDs, TEDS PO pain control PT/OT Dispo: D/C home when ok with trauma team, please d/c incisional VAC out the door - daily WC with ABDs and ace wrap   Iline OvenBrian J Kmya Placide 01/14/2018, 8:21 AM   Samson FredericBrian Alani Lacivita, MD Cell [phone]

## 2018-01-14 NOTE — Progress Notes (Signed)
Pt still waiting for home health needs to be arranged before he can be discharged. Care management working on home needs
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
## 2018-01-14 NOTE — Discharge Instructions (Signed)
Dr. Samson FredericBrian Kalene Cutler [facility] [address] [place], [place] [address] [phone]   POSTOPERATIVE DIRECTIONS    Rehabilitation, Guidelines Following Surgery   WEIGHT BEARING Partial weight bearing with assist device as directed.  touch down weight bearing right leg   HOME CARE INSTRUCTIONS  Remove items at home which could result in a fall. This includes throw rugs or furniture in walking pathways.  Continue medications as instructed at time of discharge.  You may have some home medications which will be placed on hold until you complete the course of blood thinner medication. Do not put on socks or shoes without following the instructions of your caregivers.   Sit on chairs with arms. Use the chair arms to help push yourself up when arising.  Arrange for the use of a toilet seat elevator so you are not sitting low.   Walk with walker as instructed.  You may resume a sexual relationship in one month or when given the OK by your caregiver.  Use walker as long as suggested by your caregivers.  Avoid periods of inactivity such as sitting longer than an hour when not asleep. This helps prevent blood clots.  You may return to work once you are cleared by Designer, industrial/productyour surgeon.  Do not drive a car for 6 weeks or until released by your surgeon.  Do not drive while taking narcotics.  Wear elastic stockings for two weeks following surgery during the day but you may remove then at night.  Make sure you keep all of your appointments after your operation with all of your doctors and caregivers. You should call the office at the above phone number and make an appointment for approximately two weeks after the date of your surgery. Please pick up a stool softener and laxative for home use as long as you are requiring pain medications.  ICE to the affected hip every three hours for 30 minutes at a time and then as needed for pain and swelling. Continue to use  ice on the hip for pain and swelling from surgery. You may notice swelling that will progress down to the foot and ankle.  This is normal after surgery.  Elevate the leg when you are not up walking on it.   It is important for you to complete the blood thinner medication as prescribed by your doctor.  Continue to use the breathing machine which will help keep your temperature down.  It is common for your temperature to cycle up and down following surgery, especially at night when you are not up moving around and exerting yourself.  The breathing machine keeps your lungs expanded and your temperature down.  RANGE OF MOTION AND STRENGTHENING EXERCISES  These exercises are designed to help you keep full movement of your hip joint. Follow your caregiver's or physical therapist's instructions. Perform all exercises about fifteen times, three times per day or as directed. Exercise both hips, even if you have had only one joint replacement. These exercises can be done on a training (exercise) mat, on the floor, on a table or on a bed. Use whatever works the best and is most comfortable for you. Use music or television while you are exercising so that the exercises are a pleasant break in your day. This will make your life better with the exercises acting as a break in routine you can look forward to.  Lying on your back, slowly slide your foot toward your buttocks, raising  your knee up off the floor. Then slowly slide your foot back down until your leg is straight again.  Lying on your back spread your legs as far apart as you can without causing discomfort.  Lying on your side, raise your upper leg and foot straight up from the floor as far as is comfortable. Slowly lower the leg and repeat.  Lying on your back, tighten up the muscle in the front of your thigh (quadriceps muscles). You can do this by keeping your leg straight and trying to raise your heel off the floor. This helps strengthen the largest muscle  supporting your knee.  Lying on your back, tighten up the muscles of your buttocks both with the legs straight and with the knee bent at a comfortable angle while keeping your heel on the floor.   SKILLED REHAB INSTRUCTIONS: If the patient is transferred to a skilled rehab facility following release from the hospital, a list of the current medications will be sent to the facility for the patient to continue.  When discharged from the skilled rehab facility, please have the facility set up the patient's Home Health Physical Therapy prior to being released. Also, the skilled facility will be responsible for providing the patient with their medications at time of release from the facility to include their pain medication and their blood thinner medication. If the patient is still at the rehab facility at time of the two week follow up appointment, the skilled rehab facility will also need to assist the patient in arranging follow up appointment in our office and any transportation needs.  MAKE SURE YOU:  Understand these instructions.  Will watch your condition.  Will get help right away if you are not doing well or get worse.  Pick up stool softner and laxative for home use following surgery while on pain medications. Daily dry dressing changes as needed with ABD pads and ACE wrap Continue to use ice for pain and swelling after surgery. Do not use any lotions or creams on the incision until instructed by your surgeon. Touch Down Weight Bearing Right leg. Elevate toes above nose.

## 2018-01-14 NOTE — Plan of Care (Signed)
Patient discharge instructions reviewed and questions answered.
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
## 2018-02-02 ENCOUNTER — Ambulatory Visit
Admission: RE | Admit: 2018-02-02 | Discharge: 2018-02-02 | Disposition: A | Discharge status: Home / Self Care | Payer: Worker's Compensation | Source: Ambulatory Visit | Attending: Physician Assistant | Admitting: Physician Assistant

## 2018-02-02 ENCOUNTER — Other Ambulatory Visit: Payer: Self-pay | Admitting: Physician Assistant

## 2018-02-02 DIAGNOSIS — M545 Low back pain: Secondary | ICD-10-CM

## 2018-02-02 DIAGNOSIS — R079 Chest pain, unspecified: Secondary | ICD-10-CM | POA: Diagnosis not present

## 2019-04-26 ENCOUNTER — Emergency Department
Admission: EM | Admit: 2019-04-26 | Discharge: 2019-04-26 | Disposition: A | Discharge status: Home / Self Care | Payer: Self-pay | Attending: Emergency Medicine | Admitting: Emergency Medicine

## 2019-04-26 ENCOUNTER — Other Ambulatory Visit: Payer: Self-pay

## 2019-04-26 DIAGNOSIS — J45909 Unspecified asthma, uncomplicated: Secondary | ICD-10-CM | POA: Insufficient documentation

## 2019-04-26 DIAGNOSIS — F17210 Nicotine dependence, cigarettes, uncomplicated: Secondary | ICD-10-CM | POA: Insufficient documentation

## 2019-04-26 DIAGNOSIS — Z79899 Other long term (current) drug therapy: Secondary | ICD-10-CM | POA: Insufficient documentation

## 2019-04-26 DIAGNOSIS — E119 Type 2 diabetes mellitus without complications: Secondary | ICD-10-CM | POA: Insufficient documentation

## 2019-04-26 DIAGNOSIS — Z202 Contact with and (suspected) exposure to infections with a predominantly sexual mode of transmission: Secondary | ICD-10-CM | POA: Insufficient documentation

## 2019-04-26 HISTORY — DX: Type 2 diabetes mellitus without complications: E11.9

## 2019-04-26 LAB — URINALYSIS, COMPLETE (UACMP) WITH MICROSCOPIC
Bacteria, UA: NONE SEEN
Bilirubin Urine: NEGATIVE
Glucose, UA: NEGATIVE mg/dL
Hgb urine dipstick: NEGATIVE
Ketones, ur: NEGATIVE mg/dL
Leukocytes,Ua: NEGATIVE
Nitrite: NEGATIVE
Protein, ur: NEGATIVE mg/dL
Specific Gravity, Urine: 1.025 (ref 1.005–1.030)
pH: 6 (ref 5.0–8.0)

## 2019-04-26 MED ORDER — METRONIDAZOLE 500 MG PO TABS: 500.0000 mg | ORAL_TABLET | Freq: Three times a day (TID) | ORAL | 0 refills | Status: AC
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
## 2019-04-26 NOTE — ED Notes (Signed)
See triage note  Presents with possible exposure to STD  Partner tested pos for trich  Denies any sx's
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
## 2019-05-16 ENCOUNTER — Telehealth: Payer: Self-pay | Admitting: Physician Assistant

## 2019-05-16 NOTE — Telephone Encounter (Signed)
rx for tinidazole sent with his partner for recurrent trichomoniasis as both partners need to be treated

## 2019-10-05 IMAGING — RF DG TIBIA/FIBULA 2V*R*
1 series · 9 of 9 positions shown · non-contrast
Comparison: 01/10/2018 radiographs

CLINICAL DATA: ORIF tibial fractures.

EXAM:
DG C-ARM 61-120 MIN; RIGHT TIBIA AND FIBULA - 2 VIEW

[Series 1: run · 9 of 9 slices shown]
[im 1/9]
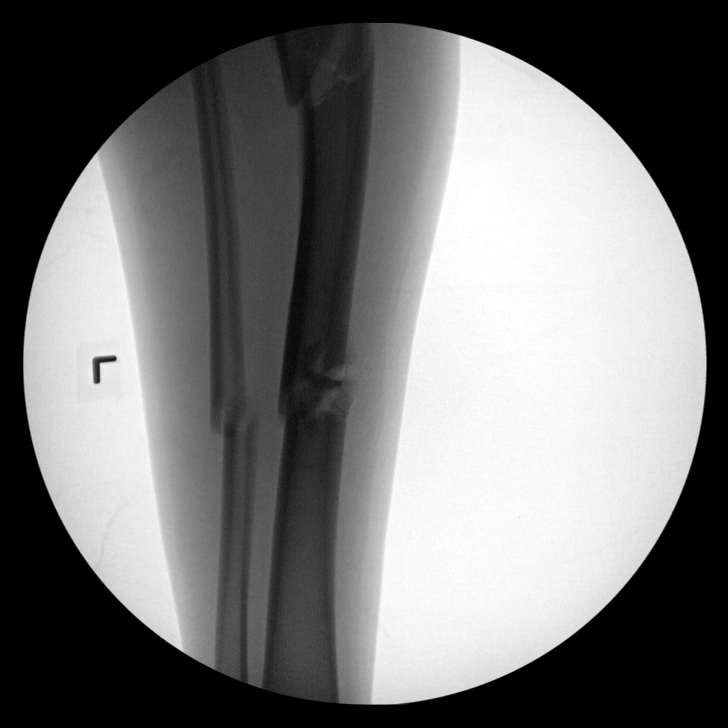
[im 2/9]
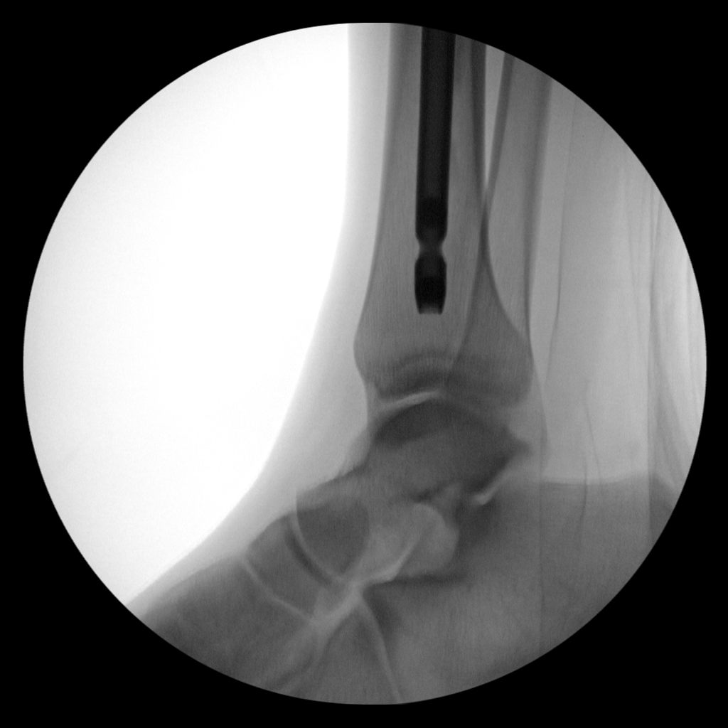
[im 3/9]
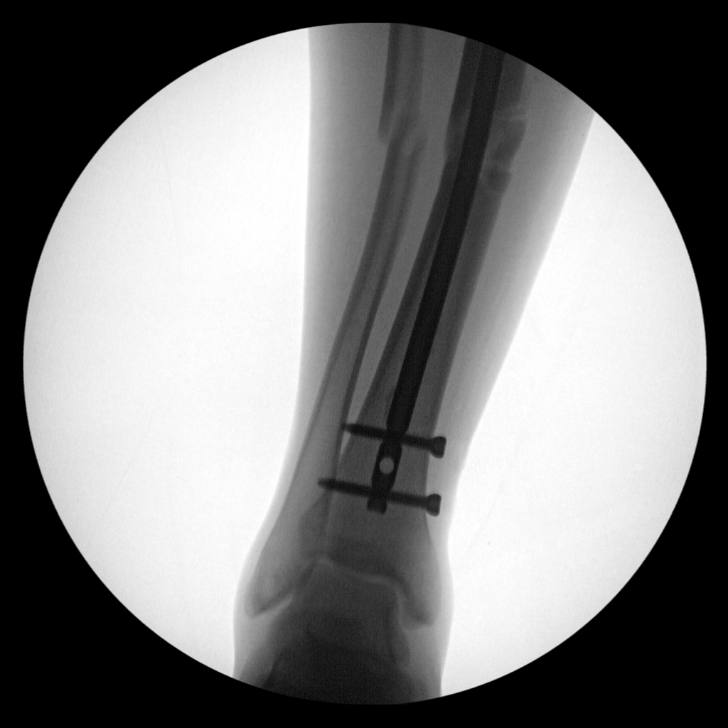
[im 4/9]
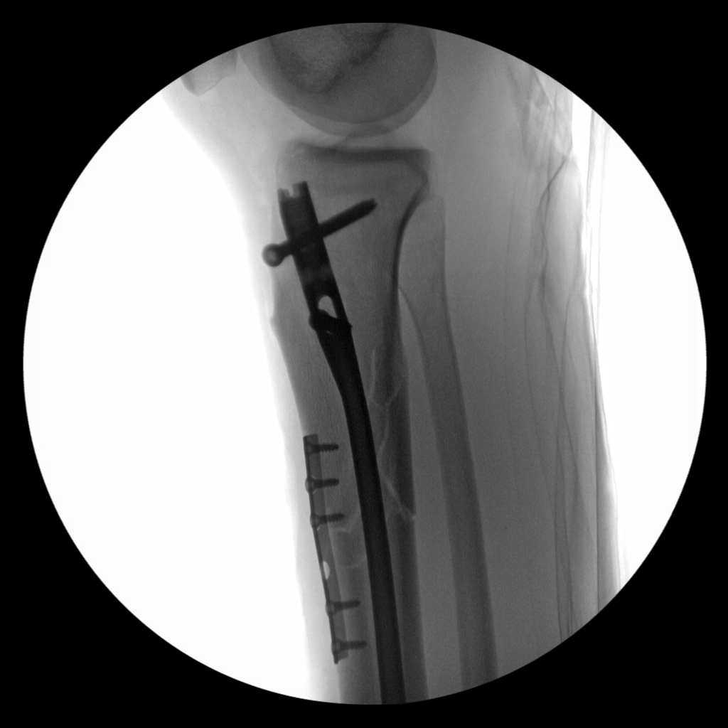
[im 5/9]
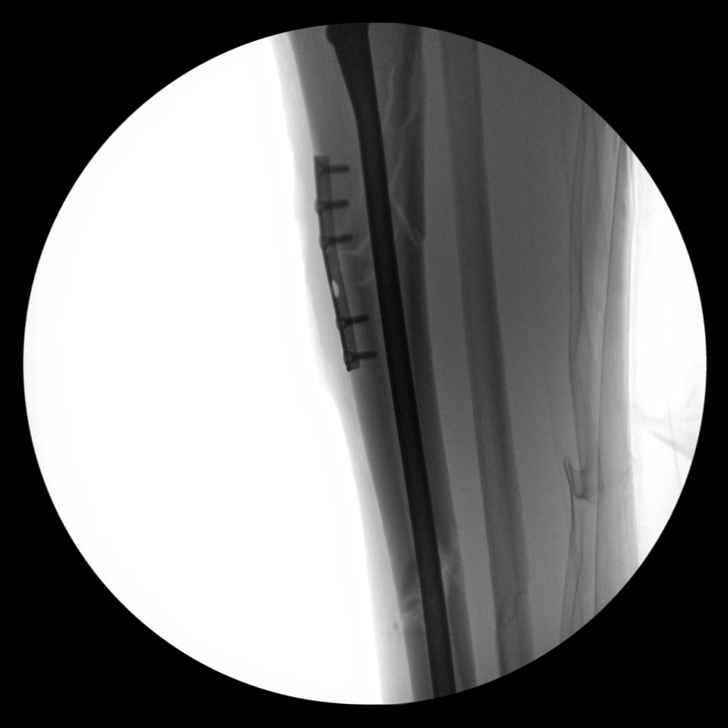
[im 6/9]
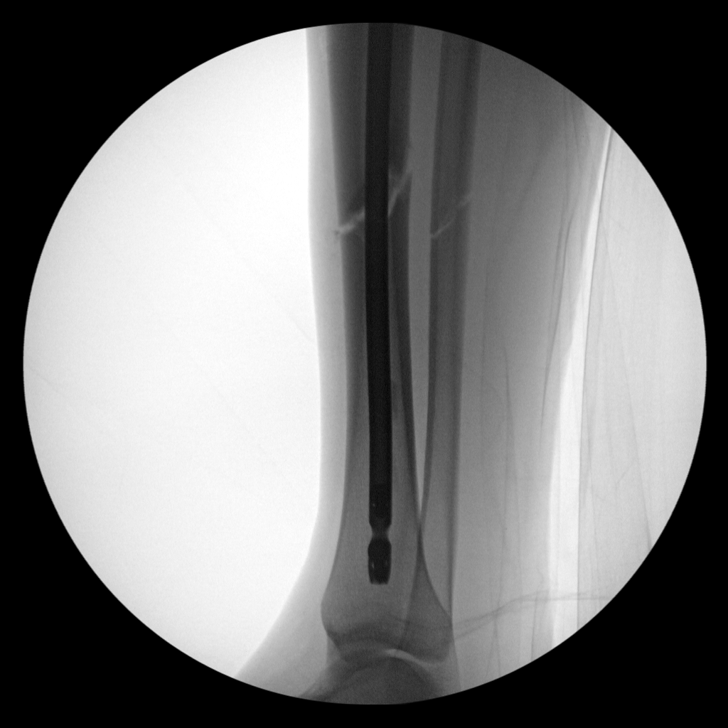
[im 7/9]
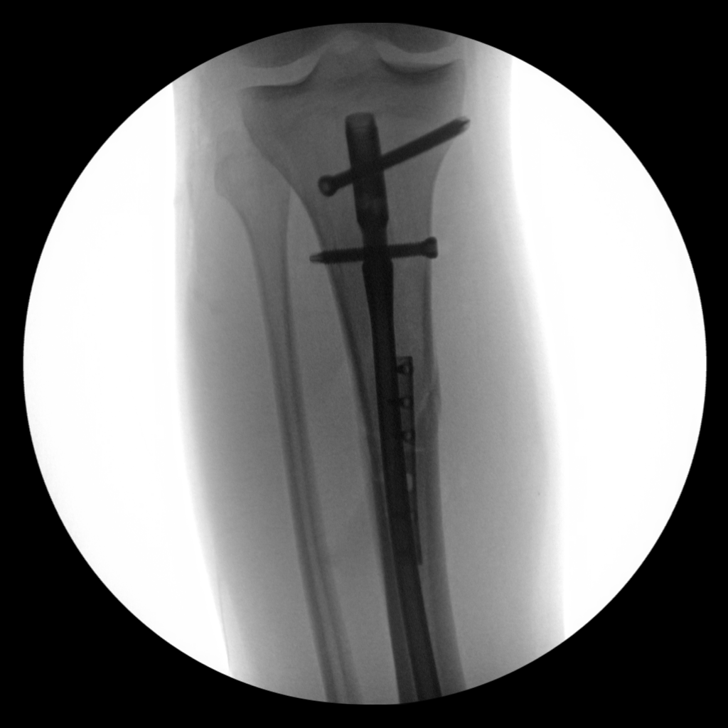
[im 8/9]
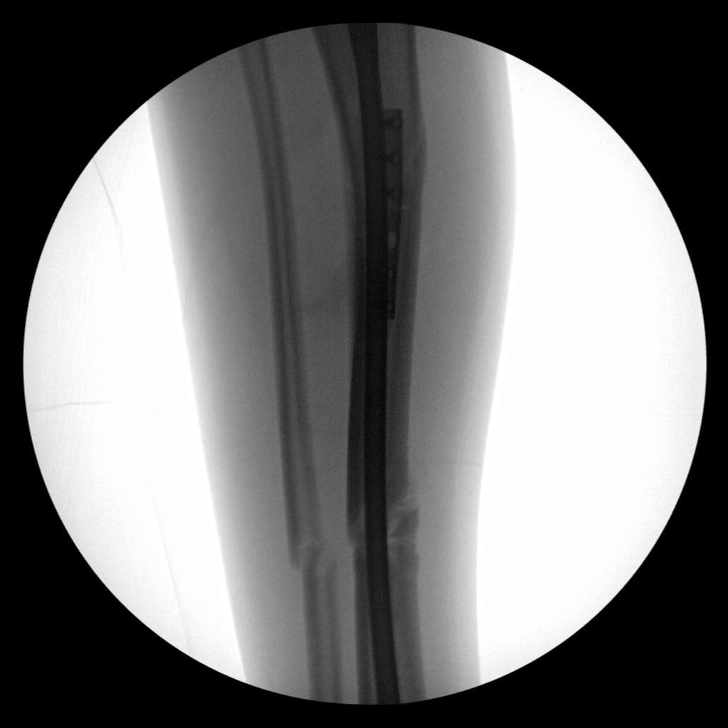
[im 9/9]
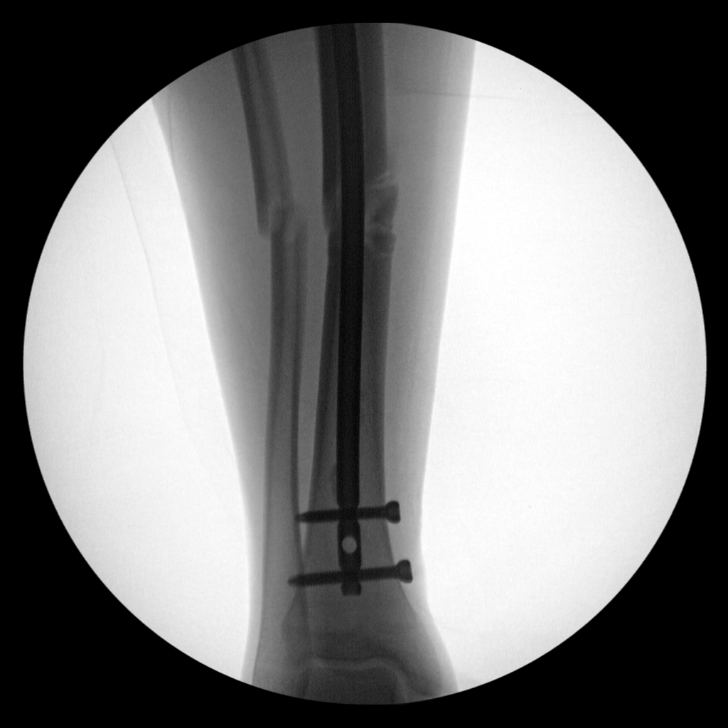

[9 of 9 positions shown; findings below may reference images not displayed]

FINDINGS: Multiple intraoperative spot views of the RIGHT tibia/fibula are
submitted postoperatively for interpretation.

Placement of intramedullary rod with proximal and distal transfixing
screws within the tibia noted. Internal plate and screw fixation of
the proximal-mid tibia identified. Surgical hardware traverse
proximal and mid tibial fractures, in near-anatomic alignment and
position.

A mid fibular fracture is unchanged.

No definite complicating features identified.
IMPRESSION: ORIF tibial fractures, in near-anatomic alignment and position.
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
